# Patient Record
Sex: Female | Born: 1995
Health system: Southern US, Community
[De-identification: ages and names within clinical notes are randomized; demographics above are authoritative.]

## PROBLEM LIST (undated history)

## (undated) DIAGNOSIS — F419 Anxiety disorder, unspecified: Secondary | ICD-10-CM

## (undated) DIAGNOSIS — F32A Depression, unspecified: Secondary | ICD-10-CM

## (undated) DIAGNOSIS — F329 Major depressive disorder, single episode, unspecified: Secondary | ICD-10-CM

## (undated) DIAGNOSIS — S93409A Sprain of unspecified ligament of unspecified ankle, initial encounter: Secondary | ICD-10-CM

## (undated) DIAGNOSIS — J45909 Unspecified asthma, uncomplicated: Secondary | ICD-10-CM

## (undated) DIAGNOSIS — F509 Eating disorder, unspecified: Secondary | ICD-10-CM

## (undated) DIAGNOSIS — S46219A Strain of muscle, fascia and tendon of other parts of biceps, unspecified arm, initial encounter: Secondary | ICD-10-CM

## (undated) HISTORY — DX: Sprain of unspecified ligament of unspecified ankle, initial encounter: S93.409A

## (undated) HISTORY — DX: Strain of muscle, fascia and tendon of other parts of biceps, unspecified arm, initial encounter: S46.219A

## (undated) HISTORY — DX: Major depressive disorder, single episode, unspecified: F32.9

## (undated) HISTORY — DX: Eating disorder, unspecified: F50.9

## (undated) HISTORY — DX: Depression, unspecified: F32.A

## (undated) HISTORY — DX: Unspecified asthma, uncomplicated: J45.909

## (undated) HISTORY — PX: FRACTURE SURGERY: SHX138

## (undated) HISTORY — PX: OTHER SURGICAL HISTORY: SHX169

---
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
## 2017-02-22 ENCOUNTER — Encounter: Payer: Self-pay | Admitting: *Deleted

## 2017-02-22 ENCOUNTER — Encounter: Payer: 59 | Attending: Pediatrics | Admitting: *Deleted

## 2017-02-22 DIAGNOSIS — F509 Eating disorder, unspecified: Secondary | ICD-10-CM | POA: Diagnosis not present

## 2017-02-22 DIAGNOSIS — Z713 Dietary counseling and surveillance: Secondary | ICD-10-CM | POA: Diagnosis not present

## 2017-02-22 NOTE — Progress Notes (Signed)
Appointment start time: 0800  Appointment end time: 0900  Patient was seen on 02/22/17 for nutrition counseling pertaining to disordered eating  Primary care provider: Duffy Bruce  Therapist: Florentina Jenny  Any other medical team members: none currently.  Wants referral to [place] "therapist made me come" because I have "weird eating behavior with depression".  Goes through periods of restriction and purging.  (SIV) States she is kinda ok right now.  A couple weeks ago things were worse.  Last summer she restricted heavily and ate the same thing.  Florentina Jenny called me yesterday saying Amani is very sick and might not be appropriate for college in the fall  Started in 2012 and she' isn't sure why.   Started seeing karla 2 weeks ago  Saw pediatrician with [place] experience in 2013, but behaviors went away and depression was worse so dealt with depression.  Eating resurfaced last summer, but didn't realize it until April or May.    Would like to be able to "not be freaked out by food all the time."  Not checking weight currently as she is avoiding it, but sometimes checks multiple times a day  Nobody really cooks at home.  They go out to eat or eat up frozen things.  Dad cooks more than anyone. Dad or [employer].  Will go to [employer] this fall and wants to live on campus.  And will have a meal plan.  States she is "fine", but then changes that.  Last summer she was in [employer] and ate salads twice daily in the cafeteria by herself and ate really quickly.  She is worried about getting into negative habits.    States she is not SIV currently due to fear of dental erosion.  Still really wants to, but doesn't. Currently not engaging in weight control behaviors and that is really stressful.   Lost 30 pounds by last August and gained 40 pounds since.    Mom recommends weight loss and has not been receptive to feedback from previous therapists making recommendations on [place]  behalf.  Mom put Deira and her twin on diets as children, but denies it  Not currently physically active due to back pain   Growth Metrics: Median BMI for age: 53.5 BMI today: 31 % median today:  100%+ Previous growth data: weight/age  54-95; height/age at 25-50; BMI/age 66-97% Not weight restoration needed.  Goal is to normalize eating and behaviors   Medical Information:  Changes in hair, skin, nails since ED started: none reported Chewing/swallowing difficulties : none Relux or heartburn: some after purging Trouble with teeth: none.  Saw dentist on Monday LMP without the use of hormones: 02/14/17.  regular   Constipation, diarrhea: none reported.  BM every couple day.  Not a strain.  No blood Some dizziness, tingling  In hands and feet Headaches and backaches for 4 months.  Seeing specialist soon No vision changes Low energy level.  Always been tired.  Takes naps during the day Sometimes difficult to concentrate Sleeping 8 hours.  Still feels tired when she wakes up.  Never had sleep study No blood work recently for deficiency.    Things have actually improved somewhat, but still tired.    Negative for cold intolerance.  Poor body image.  Wears long sleeves   Dietary assessment: A typical day consists of 3 meals and 1 snacks  Safe foods include: tostitos, diet mt dew, frozen yogurt, granola bars, peas, Avoided foods include:nothing is more  scary than others, but most food in general other than safe foods are "bad"  24 hour recall:  B: toast with nutella (1) with diet mt dew L: veggie burger on bun with lettuce tomato, onion, cheese; fries with ranch.  Sprite S: popcorn. Regular mt dew S: ice cream (chocolate and strawberry) The regular soda was not normal  Typical B: granola bar (quaker smores) with diet mt dew L: grilled cheese sandwich and peas, diet mt dew S: tositos- rarely with salsa or spinach dip D: burrito with rice, pinto, lettuce, corn, tomatoes, sour  cream Beverages: 5-6 20 oz bottles, 1-2 bottles water   EAT-26 administered Patient score: 28  Estimated energy intake: 1000 kcal  Estimated energy needs: 1800-2000 kcal 225-250 g CHO 90-100 g pro 60-67 g fat  Nutrition Diagnosis: NI-1.4 Inadequate energy intake As related to disordered eating.  As evidenced by dietary recall.  Intervention/Goals: Nutrition counseling provided.  Challenged beauty myth.  Discussed normal growth/development of adolescents.  explained that while she is 21 years old, parental involvement would be recommended if she permitted.  She is very concerned that mom would not be receptive  Recommended equal water to Diet Mt Dew    Monitoring and Evaluation: Patient will follow up in 2 weeks.

## 2017-02-24 ENCOUNTER — Encounter: Payer: Self-pay | Admitting: Emergency Medicine

## 2017-02-24 ENCOUNTER — Emergency Department: Payer: 59

## 2017-02-24 ENCOUNTER — Emergency Department
Admission: EM | Admit: 2017-02-24 | Discharge: 2017-02-24 | Disposition: A | Discharge status: Home / Self Care | Payer: 59 | Attending: Emergency Medicine | Admitting: Emergency Medicine

## 2017-02-24 DIAGNOSIS — M549 Dorsalgia, unspecified: Secondary | ICD-10-CM

## 2017-02-24 DIAGNOSIS — J45909 Unspecified asthma, uncomplicated: Secondary | ICD-10-CM | POA: Insufficient documentation

## 2017-02-24 DIAGNOSIS — R519 Headache, unspecified: Secondary | ICD-10-CM

## 2017-02-24 DIAGNOSIS — R51 Headache: Secondary | ICD-10-CM | POA: Diagnosis not present

## 2017-02-24 DIAGNOSIS — R200 Anesthesia of skin: Secondary | ICD-10-CM | POA: Diagnosis present

## 2017-02-24 DIAGNOSIS — R202 Paresthesia of skin: Secondary | ICD-10-CM

## 2017-02-24 DIAGNOSIS — Z79899 Other long term (current) drug therapy: Secondary | ICD-10-CM | POA: Insufficient documentation

## 2017-02-24 LAB — URINALYSIS, COMPLETE (UACMP) WITH MICROSCOPIC
BILIRUBIN URINE: NEGATIVE
Bacteria, UA: NONE SEEN
Glucose, UA: NEGATIVE mg/dL
Hgb urine dipstick: NEGATIVE
Ketones, ur: NEGATIVE mg/dL
LEUKOCYTES UA: NEGATIVE
Nitrite: NEGATIVE
Protein, ur: NEGATIVE mg/dL
RBC / HPF: NONE SEEN RBC/hpf (ref 0–5)
SPECIFIC GRAVITY, URINE: 1.015 (ref 1.005–1.030)
pH: 5 (ref 5.0–8.0)

## 2017-02-24 LAB — BASIC METABOLIC PANEL
Anion gap: 8 (ref 5–15)
BUN: 10 mg/dL (ref 6–20)
CHLORIDE: 109 mmol/L (ref 101–111)
CO2: 22 mmol/L (ref 22–32)
CREATININE: 0.84 mg/dL (ref 0.44–1.00)
Calcium: 8.8 mg/dL — ABNORMAL LOW (ref 8.9–10.3)
GFR calc Af Amer: >60 mL/min (ref >60)
GFR calc non Af Amer: >60 mL/min (ref >60)
GLUCOSE: 117 mg/dL — AB (ref 65–99)
Potassium: 3.5 mmol/L (ref 3.5–5.1)
Sodium: 139 mmol/L (ref 135–145)

## 2017-02-24 LAB — CBC
HCT: 37.3 % (ref 35.0–47.0)
Hemoglobin: 12.9 g/dL (ref 12.0–16.0)
MCH: 28 pg (ref 26.0–34.0)
MCHC: 34.5 g/dL (ref 32.0–36.0)
MCV: 81.1 fL (ref 80.0–100.0)
Platelets: 308 10*3/uL (ref 150–440)
RBC: 4.6 MIL/uL (ref 3.80–5.20)
RDW: 14 % (ref 11.5–14.5)
WBC: 11.2 10*3/uL — ABNORMAL HIGH (ref 3.6–11.0)

## 2017-02-24 LAB — POCT PREGNANCY, URINE: PREG TEST UR: NEGATIVE

## 2017-02-24 LAB — TROPONIN I: Troponin I: <0.03 ng/mL (ref <0.03)

## 2017-02-24 MED ORDER — GADOBENATE DIMEGLUMINE 529 MG/ML IV SOLN
15.0000 mL | Freq: Once | INTRAVENOUS | Status: AC | PRN
  Administered 2017-02-24: 15 mL via INTRAVENOUS

## 2017-02-24 NOTE — ED Provider Notes (Signed)
[facility] Emergency Department Provider Note  ____________________________________________  Time seen: Approximately 6:34 PM  I have reviewed the triage vital signs and the nursing notes.   HISTORY  Chief Complaint Headache and Numbness   HPI Sharon Barton is a 21 y.o. female with history of anxiety and depression who presents for evaluation of multiple complaints. Patient reports that for the last 4 months she has been having several weekly episodes of headache, upper back pain, intermittent difficulty initiating urination, has been feeling clumsy and bumping into things, has had intermittent paresthesias of her extremities worse on the right upper extremity. She saw her primary care doctor on Tuesday who sent her for x-ray of her thoracic spine which according to patient was negative. Today she called her primary care doctor to let her know that her symptoms were worse and she was instructed to come to the emergency room for evaluation. Patient denies visual changes, weakness, trauma, fevers, unintentional weight loss. She reports that her pain in her back is constant, moderate, located at the level of her bra in the midline. Her HAs are worse at night and woke her up from sleep, they are mild to moderate located mostly on the R frontal area. She denies family history of demyelinating disorders. No urinary or bowel incontinence or retention, no saddle anesthesia.  Past Medical History:  Diagnosis Date  . Anxiety and depression   . Asthma     There are no active problems to display for this patient.   History reviewed. No pertinent surgical history.  Prior to Admission medications   Medication Sig Start Date End Date Taking? Authorizing Provider  Brexpiprazole (REXULTI) 1 MG TABS Take by mouth.    [provider]  cholecalciferol (VITAMIN D) 1000 units tablet Take 1,000 Units by mouth daily.    [provider]  cloNIDine  (CATAPRES) 0.2 MG tablet Take 0.2 mg by mouth 2 (two) times daily.    [provider]  cyclobenzaprine (FLEXERIL) 10 MG tablet Take 1 tablet (10 mg total) by mouth 3 (three) times daily as needed for muscle spasms. Patient not taking: Reported on 02/22/2017 10/25/16   Norval Gable, MD  lamoTRIgine (LAMICTAL) 150 MG tablet Take 100 mg by mouth daily.     [provider]  lurasidone (LATUDA) 80 MG TABS tablet Take 80 mg by mouth daily with breakfast.    [provider]  sertraline (ZOLOFT) 100 MG tablet Take 150 mg by mouth daily.     [provider]  Irwin 28 0.25-35 MG-MCG tablet Take 1 tablet by mouth daily. 02/03/17   [provider]    Allergies Patient has no known allergies.  Family History  Problem Relation Age of Onset  . Hypertension Father   . Cancer Maternal Grandmother     Social History Social History  Substance Use Topics  . Smoking status: Never Smoker  . Smokeless tobacco: Never Used  . Alcohol use No    Review of Systems  Constitutional: Negative for fever. Eyes: Negative for visual changes. ENT: Negative for sore throat. Neck: No neck pain  Cardiovascular: Negative for chest pain. Respiratory: Negative for shortness of breath. Gastrointestinal: Negative for abdominal pain, vomiting or diarrhea. Genitourinary: Negative for dysuria. + difficulty initiating urine Musculoskeletal: + upper back pain. Skin: Negative for rash. Neurological: + HA, intermittent paresthesias of all 4 extremities Psych: No SI or HI  ____________________________________________   PHYSICAL EXAM:  VITAL SIGNS: ED Triage Vitals  Enc Vitals  Group     BP 02/24/17 1554 (!) 145/78     Pulse Rate 02/24/17 1554 97     Resp 02/24/17 1554 16     Temp 02/24/17 1554 98.4 F (36.9 C)     Temp Source 02/24/17 1554 Oral     SpO2 02/24/17 1554 97 %     Weight 02/24/17 1555 190 lb (86.2 kg)     Height 02/24/17 1555 5\' 3"  (1.6 m)     Head  Circumference --      Peak Flow --      Pain Score 02/24/17 1554 4     Pain Loc --      Pain Edu? --      Excl. in [place]? --     Constitutional: Alert and oriented. Well appearing and in no apparent distress. HEENT:      Head: Normocephalic and atraumatic.         Eyes: Conjunctivae are normal. Sclera is non-icteric. PERRL, bilateral red reflexes present       Mouth/Throat: Mucous membranes are moist.       Neck: Supple with no signs of meningismus. Cardiovascular: Regular rate and rhythm. No murmurs, gallops, or rubs. 2+ symmetrical distal pulses are present in all extremities. No JVD. Respiratory: Normal respiratory effort. Lungs are clear to auscultation bilaterally. No wheezes, crackles, or rhonchi.  Gastrointestinal: Soft, non tender, and non distended with positive bowel sounds. No rebound or guarding. Genitourinary: No CVA tenderness. Musculoskeletal: Patient has diffuse R paraspinal ttp, no midline c/t/l spine ttp. Nontender with normal range of motion in all extremities. No edema, cyanosis, or erythema of extremities. Neurologic: Normal speech and language. A & O x3, PERRL, no nystagmus, no visual field deficits, CN II-XII intact, motor testing reveals good tone and bulk throughout. There is no evidence of pronator drift or dysmetria. Muscle strength is 5/5 throughout. Deep tendon reflexes are 1+ throughout with downgoing toes. Sensory examination is intact. Gait is normal. Skin: Skin is warm, dry and intact. No rash noted. Psychiatric: Mood and affect are normal. Speech and behavior are normal.  ____________________________________________   LABS (all labs ordered are listed, but only abnormal results are displayed)  Labs Reviewed  BASIC METABOLIC PANEL - Abnormal; Notable for the following:       Result Value   Glucose, Bld 117 (*)    Calcium 8.8 (*)    All other components within normal limits  CBC - Abnormal; Notable for the following:    WBC 11.2 (*)    All other  components within normal limits  URINALYSIS, COMPLETE (UACMP) WITH MICROSCOPIC - Abnormal; Notable for the following:    Color, Urine YELLOW (*)    APPearance CLEAR (*)    Squamous Epithelial / LPF 0-5 (*)    All other components within normal limits  TROPONIN I  POCT PREGNANCY, URINE   ____________________________________________  EKG  ED ECG REPORT I, Rudene Re, the attending physician, personally viewed and interpreted this ECG.  Normal sinus rhythm, rate of 95, normal intervals, normal axis, no ST elevations or depressions. ____________________________________________  RADIOLOGY  Head CT:  Negative  MRI head and thoracic spine: 1. No acute intracranial abnormality or abnormal enhancement. 2. Faint white matter T2 FLAIR hyperintensities as described above of unlikely clinical significance. No specific findings for demyelination, vasculopathy, or infectious/inflammatory sequelae. Is symptoms persist consider 6 month follow-up with MRI of the brain. ____________________________________________   PROCEDURES  Procedure(s) performed: None Procedures Critical Care performed:  None ____________________________________________  INITIAL IMPRESSION / ASSESSMENT AND PLAN / ED COURSE  21 y.o. female with history of anxiety and depression who presents for evaluation of multiple complaints including weekly headaches, upper back pain, intermittent paresthesias of her extremities, difficulty initiating urine. Symptoms have all been going on for 4 months. Patient is extremely well appearing, in no distress, her vital signs are within normal limits, patient is neurologically intact per exam above. Unclear etiology of patient's symptoms however this could be due to a demyelinating disorder. We'll do a head CT to rule out brain tumor. We'll check basic blood work and urinalysis. Postvoid residual was done on patient which was 32 cc. Plan to f/u results of labs, urine, head CT and if  all negative patient will be referred to Neurology for further evaluation.     _________________________ 6:47 PM on 02/24/2017 -----------------------------------------  Labs, urine, and head CT negative. Will get MRI to rule out Demyelinating disorder or transverse myelitis.  _________________________ 11:11 PM on 02/24/2017 -----------------------------------------  MRIs with no acute findings. Patient remains completely neurologically intact. Patient can be discharged home at this time and follow-up with pediatrician.  Pertinent labs & imaging results that were available during my care of the patient were reviewed by me and considered in my medical decision making (see chart for details).    ____________________________________________   FINAL CLINICAL IMPRESSION(S) / ED DIAGNOSES  Final diagnoses:  Acute nonintractable headache, unspecified headache type  Upper back pain on right side  Paresthesia  Back pain      NEW MEDICATIONS STARTED DURING THIS VISIT:  New Prescriptions   No medications on file     Note:  This document was prepared using Dragon voice recognition software and may include unintentional dictation errors.    Rudene Re, MD 02/24/17 2312

## 2017-02-24 NOTE — ED Notes (Signed)
Patient transported to CT 

## 2017-02-24 NOTE — ED Notes (Signed)
Patient r/f MRI

## 2017-02-24 NOTE — ED Triage Notes (Signed)
Pt in via POV with parents, advised to be seen per pediatrician.  Pt with intermittent headaches, back pain and numbness to right arm x 4 months.  Pt reports only imaging to be done is back xray which was unremarkable.  Pt denies any fevers, denies any changes to vision.  Vitals WDL, NAD noted at this time.

## 2017-02-24 NOTE — ED Notes (Signed)
Patient transported to MRI 

## 2017-03-09 ENCOUNTER — Encounter: Payer: 59 | Admitting: *Deleted

## 2017-03-09 DIAGNOSIS — Z713 Dietary counseling and surveillance: Secondary | ICD-10-CM | POA: Diagnosis not present

## 2017-03-09 DIAGNOSIS — F509 Eating disorder, unspecified: Secondary | ICD-10-CM

## 2017-03-09 NOTE — Patient Instructions (Signed)
Body Positive Power by Celedonio Miyamoto- [email or web address] 

## 2017-03-09 NOTE — Progress Notes (Signed)
Appointment start time: 1400  Appointment end time: 1500  Patient was seen on 03/10/17 for nutrition counseling pertaining to disordered eating  Primary care provider: Duffy Bruce  Therapist: Florentina Jenny  Any other medical team members: none currently.     Assessment Referral to adolescent medicine placed.  Mom did not return the call to schedule the appointment. Red pod not available by skype today.  I gave Shanicka the number to call herself to get scheduled asap  Is not doing well.  Is experiencing a lot of tingling, pain, lightheadedness, headaches.  Multiple visits to neuro Sleeping ok, poor energy. Sometimes takes naps during the day.   Wakes up tired  It also upset because she was supposed to do to [place].  She realized that she shouldn't go.  Sees Karla every week.  Going ok. Eating is going "ok".  She is stressed out and "eating crap" and that stresses her out.  She is worried about starting school in the fall She is really overwhelmed about school starting.  Became very upset and emotional in session multiple times today.  Took a long time to calm herself down.   I have spoken to [place] who thinks she is not ready for college in the fall.  Talking with Jennika today I am also concerned about her overall mental health.  She seems very overwhelmed.   She is also upset because she feels she has been eating inappropriately due to being upset and home alone    Growth Metrics: Median BMI for age: 89.5 BMI today: 31 % median today:  100%+ Previous growth data: weight/age  34-95; height/age at 25-50; BMI/age 80-97% Not weight restoration needed.  Goal is to normalize eating and behaviors    Dietary assessment: A typical day consists of 2-3 meals and 1 snacks  Safe foods include: tostitos, diet mt dew, frozen yogurt, granola bars, peas, Avoided foods include:nothing is more scary than others, but most food in general other than safe foods are "bad"  24 hour recall:  B: slept L:  pasta with broccoli S: fruit snacks D: burrito and tortilla chips Beverages: 2 water, 60 diet mt dew    Estimated energy intake: 1000 kcal   Estimated energy needs: 1800-2000 kcal 225-250 g CHO 90-100 g pro 60-67 g fat  Nutrition Diagnosis: NI-1.4 Inadequate energy intake As related to disordered eating.  As evidenced by dietary recall.  Intervention/Goals: Nutrition counseling provided.  Please call Dr. Blanch Media office.  Attempted to discuss ways to lessen stress, but she was not relieved.  She became more agitated.    Discussed need for 3 meals/day.  Challenge diet/DE messages.  Suggested Body Positive Power book Please drink more water    Monitoring and Evaluation: Patient will follow up in 2 weeks.

## 2017-03-21 ENCOUNTER — Encounter: Payer: 59 | Attending: Pediatrics | Admitting: *Deleted

## 2017-03-21 DIAGNOSIS — Z713 Dietary counseling and surveillance: Secondary | ICD-10-CM | POA: Diagnosis not present

## 2017-03-21 DIAGNOSIS — F509 Eating disorder, unspecified: Secondary | ICD-10-CM | POA: Insufficient documentation

## 2017-03-21 DIAGNOSIS — E639 Nutritional deficiency, unspecified: Secondary | ICD-10-CM

## 2017-03-21 NOTE — Patient Instructions (Addendum)
Read Jes Baker's Things No One Tells Fat Girls  Breakfast: english muffin with egg or cheese Yogurt parfait with granola and fruit Eggs with anything  Try to have protein and fiber and fat with all meals to help you stay fullner longer Continue drinking more water over diet mt dew   Snacks Banana and cheesestick or yogurt Bars [facility] Protein or Luna/Lara/Clif) Yogurt    Make a list of foods you like Write down nutrition concerns.     Call Dr. Blanch Media office to schedule appt [phone]

## 2017-03-21 NOTE — Progress Notes (Signed)
Appointment start time: 0830 Appointment end time: 0930  Patient was seen on 03/21/17 for nutrition counseling pertaining to disordered eating  Primary care provider: Duffy Bruce  Therapist: Florentina Jenny  Any other medical team members: none currently.     Assessment Still not scheduled with adolescent medicine.  Lost the number Things are going ok.  Has a job interview today.  Is much better emotionally than last time Is moving into college this week, Is considering dropping a class, but also doesn't want to look "like a slacker" when she tries to transfer  Eating is fine, but talking about it make her nervous.  In session today she became very anxious and tearful.  When asked about her feelings, she responds "I don't know".  She rocks in her seat in tears and takes several moments to collect herself.  Several times.  This is a similar reaction to last visit.    When she is calmer, she is able to say that she is unhappy with her body and feels overwhelmed with food rules.  Feels guilty about her food choices/feels judged.  Is concerned she will not be taken seriously because of her weight and wants to be thinner to be more pretty and more accepted.    Lost 30 pounds in the past and gained 45 back.      Growth Metrics: Median BMI for age: 39.5 BMI today: 31 % median today:  100%+ Previous growth data: weight/age  56-95; height/age at 25-50; BMI/age 69-97% Not weight restoration needed.  Goal is to normalize eating and behaviors    Dietary assessment: A typical day consists of 2-3 meals and 1 snacks  Safe foods include: tostitos, diet mt dew, frozen yogurt, granola bars, peas, Avoided foods include:nothing is more scary than others, but most food in general other than safe foods are "bad"  24 hour recall:  B: english muffin L: cheese and vegetable sandwich, fries, mint ice  Cream D: salad, fries Beverages: water, diet mt dew    Estimated energy intake: 1000-1200 kcal    Estimated energy needs: 1800-2000 kcal 225-250 g CHO 90-100 g pro 60-67 g fat  Nutrition Diagnosis: NI-1.4 Inadequate energy intake As related to disordered eating.  As evidenced by dietary recall.  Intervention/Goals: Nutrition counseling provided.  Please call Dr. Blanch Media office.   Challenged diet mentality and beauty standards.  Discussed metabolic effects of dieting and why that will never be successful.  Discussed ideas of body liberation and nourishing herself the way she deserves.  She was still very anxious and not able to articulate much of a response  *I scheduled her with red pod via Skype for 8/28   Monitoring and Evaluation: Patient will follow up in 2 weeks.

## 2017-04-06 ENCOUNTER — Encounter: Payer: 59 | Admitting: *Deleted

## 2017-04-06 ENCOUNTER — Encounter: Payer: Self-pay | Admitting: Pediatrics

## 2017-04-06 DIAGNOSIS — Z713 Dietary counseling and surveillance: Secondary | ICD-10-CM | POA: Diagnosis not present

## 2017-04-06 DIAGNOSIS — E639 Nutritional deficiency, unspecified: Secondary | ICD-10-CM

## 2017-04-06 NOTE — Progress Notes (Signed)
Appointment start time: 0830 Appointment end time: 0930  Patient was seen on 03/21/17 for nutrition counseling pertaining to disordered eating  Primary care provider: Duffy Bruce  Therapist: Florentina Jenny  Any other medical team members: none currently.     Assessment College is going well.  Is happy to be living away from home.  Dropped a class.  Also came out as bisexual. She is adjusting well.  Also got a job at [employer] and starts training next week.  Has appointment with dr Henrene Pastor next week.    Thinks eating is going pretty well.  Eats 3 meals/day in the dining hall except when she misses breakfast and has a protein bar. Eats by herself more often than not doesn't like talking to people.  Does eat with roommate on the weekends.  Overall eating behaviors have improved, but she becomes tearful every session when food is brought up.  She is very very very anxious about eating and her body   Sleeping really well.  Energy is poor.  Not sure why.  Getting more sleep, but wakes up tired.   No GI distress.  Negative for cold intolerance..  No dizziness  Goes to gym at school.  Rides stationary bike some and does weight machines. Does some rock climbing. Was climbing 7 days/week, but realized that was excessive and is cutting back to 3 days/week.  Plans to so some additional machine work.  Will bring bike to campus.    Thinks she is eating too much "junk food" and is adjusting to having more freedom. It's hard to get her protein vegetarian in the dining hall.  Has been eating eggs   Growth Metrics: Median BMI for age: 72.5 BMI today: 31 % median today:  100%+ Previous growth data: weight/age  66-95; height/age at 25-50; BMI/age 60-97% Not weight restoration needed.  Goal is to normalize eating and behaviors    Dietary assessment: A typical day consists of 2-3 meals and 1 snacks  Safe foods include: tostitos, diet mt dew, frozen yogurt, granola bars, peas, Avoided foods include:nothing  is more scary than others, but most food in general other than safe foods are "bad"  24 hour recall:  B: Luna bar L: pasta' S: twix D: veggie wrap Water and diet mt dew   Estimated energy intake: 1000-1200 kcal   Estimated energy needs: 1800-2000 kcal 225-250 g CHO 90-100 g pro 60-67 g fat  Nutrition Diagnosis: NI-1.4 Inadequate energy intake As related to disordered eating.  As evidenced by dietary recall.  Intervention/Goals: Nutrition counseling provided. Please talk with dr Henrene Pastor about poor energy, in addition to other medication needs.  Suggested [employer] people on [employer] and Body Positive Power.   Challenged diet mentality/fatphobia.  Emphasized need for adequate fuel   Monitoring and Evaluation: Patient will follow up in 2 weeks.

## 2017-04-11 ENCOUNTER — Ambulatory Visit (INDEPENDENT_AMBULATORY_CARE_PROVIDER_SITE_OTHER): Payer: 59 | Admitting: Clinical

## 2017-04-11 ENCOUNTER — Encounter: Payer: Self-pay | Admitting: Family

## 2017-04-11 ENCOUNTER — Ambulatory Visit (INDEPENDENT_AMBULATORY_CARE_PROVIDER_SITE_OTHER): Payer: 59 | Admitting: Family

## 2017-04-11 VITALS — BP 116/76 | HR 94 | Ht 63.0 in | Wt 197.6 lb

## 2017-04-11 DIAGNOSIS — Z3202 Encounter for pregnancy test, result negative: Secondary | ICD-10-CM

## 2017-04-11 DIAGNOSIS — F419 Anxiety disorder, unspecified: Secondary | ICD-10-CM

## 2017-04-11 DIAGNOSIS — Z1389 Encounter for screening for other disorder: Secondary | ICD-10-CM

## 2017-04-11 DIAGNOSIS — Z113 Encounter for screening for infections with a predominantly sexual mode of transmission: Secondary | ICD-10-CM

## 2017-04-11 DIAGNOSIS — F411 Generalized anxiety disorder: Secondary | ICD-10-CM | POA: Diagnosis not present

## 2017-04-11 LAB — POCT URINALYSIS DIPSTICK
Bilirubin, UA: NEGATIVE
Blood, UA: NEGATIVE
Glucose, UA: NEGATIVE
Ketones, UA: NEGATIVE
LEUKOCYTES UA: NEGATIVE
NITRITE UA: NEGATIVE
PROTEIN UA: NEGATIVE
Spec Grav, UA: 1.015 (ref 1.010–1.025)
UROBILINOGEN UA: NEGATIVE U/dL — AB
pH, UA: 5 (ref 5.0–8.0)

## 2017-04-11 LAB — POCT RAPID HIV: RAPID HIV, POC: NEGATIVE

## 2017-04-11 LAB — POCT URINE PREGNANCY: Preg Test, Ur: NEGATIVE

## 2017-04-11 MED ORDER — HYDROXYZINE PAMOATE 25 MG PO CAPS: 25.0000 mg | ORAL_CAPSULE | Freq: Three times a day (TID) | ORAL | 0 refills | Status: DC | PRN
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
## 2017-04-11 NOTE — BH Specialist Note (Addendum)
Integrated Behavioral Health Initial Visit  MRN: [id number] Name: Sharon Barton   Session Start time: 2:02 PM  Session End time: 2:23pm Total time: 73min  Type of Service: [place] Interpretor:No. Interpretor Name and Language: n/a   Warm Hand Off Completed.       SUBJECTIVE: Sharon Barton is a 21 y.o. female accompanied by patient. Patient was referred by Dr. Mady Gemma. Millican for disordered eating and anxiety symptoms. 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 Millican, FNP  ASSESSMENT: Patient currently experiencing significant anxiety during the visit and interested in medication management for her symptoms.  Patient may benefit from continuing psycho therapy and re-evaluation of current medication management of anxiety symptoms.  PLAN: 1. Follow up with behavioral health clinician on : No f/u scheduled since patient has ongoing psycho therapist, K. Townsend. 2. Behavioral recommendations:  * Continue with psycho therapy * Complete evaluation with Adolescent Team 3. Referral(s): None at this  time 4. "From scale of 1-10, how likely are you to follow plan?": Pt agreed to above plan  Toney Rakes, LCSW

## 2017-04-11 NOTE — Patient Instructions (Signed)
Take hydroxyzine (vistaril 25 mg) every 8 hours as needed for anxiety.

## 2017-04-12 LAB — GC/CHLAMYDIA PROBE AMP
CT Probe RNA: NOT DETECTED
GC Probe RNA: NOT DETECTED

## 2017-04-12 NOTE — Addendum Note (Signed)
Addended by: Fanny Dance on: 04/12/2017 03:38 PM   Modules accepted: Level of Service

## 2017-04-19 ENCOUNTER — Encounter: Payer: 59 | Attending: Pediatrics | Admitting: *Deleted

## 2017-04-19 DIAGNOSIS — F509 Eating disorder, unspecified: Secondary | ICD-10-CM | POA: Insufficient documentation

## 2017-04-19 DIAGNOSIS — E639 Nutritional deficiency, unspecified: Secondary | ICD-10-CM

## 2017-04-19 DIAGNOSIS — Z713 Dietary counseling and surveillance: Secondary | ICD-10-CM | POA: Insufficient documentation

## 2017-04-19 NOTE — Patient Instructions (Addendum)
Read Body Positive Power Drink more caffeine free fluids, preferably water, but also caffeine free soda Pick up medication Try to eat without distractions.  Use hunger scale

## 2017-04-19 NOTE — Progress Notes (Signed)
Appointment start time: 1400 Appointment end time: 1500  Patient was seen on 04/19/17 for nutrition counseling pertaining to disordered eating  Primary care provider: Duffy Bruce  Therapist: Florentina Jenny  Any other medical team members: none currently.     Assessment Missed her appointment with [facility] yesterday.  Thinks it's going ok.  Was elected to [employer]. School is going well.   Has not started hydroxyzine as she forgot about it.  Hopes dad can bring it to campus.    Struggling with body image  Growth Metrics: Median BMI for age: 51.5 BMI today: 31 % median today:  100%+ Previous growth data: weight/age  24-95; height/age at 25-50; BMI/age 42-97% Not weight restoration needed.  Goal is to normalize eating and behaviors    Dietary assessment: A typical day consists of 2-3 meals and 1 snacks  Safe foods include: tostitos, diet mt dew, frozen yogurt, granola bars, peas, Avoided foods include:nothing is more scary than others, but most food in general other than safe foods are "bad"  24 hour recall:  B: fruit loops L: cheese, tomato, onion, pickle sandwich. Tater tots D: veggie sandwich with cheese. With fries and ketchup Beverages: 32 oz water, 20 oz diet dew, 16 oz regular dew   Estimated energy intake: 1000-1200 kcal   Estimated energy needs: 1800-2000 kcal 225-250 g CHO 90-100 g pro 60-67 g fat  Nutrition Diagnosis: NI-1.4 Inadequate energy intake As related to disordered eating.  As evidenced by dietary recall.  Intervention/Goals: Nutrition counseling provided.  Challenged diet mentality/fatphobia.  Emphasized need for adequate fuel and fluids.  Discussed hunger scale, starting her medication, and reading [employer] books   Monitoring and Evaluation: Patient will follow up in 2 weeks.

## 2017-05-04 ENCOUNTER — Encounter: Payer: 59 | Admitting: *Deleted

## 2017-05-04 DIAGNOSIS — E639 Nutritional deficiency, unspecified: Secondary | ICD-10-CM

## 2017-05-04 DIAGNOSIS — Z713 Dietary counseling and surveillance: Secondary | ICD-10-CM | POA: Diagnosis not present

## 2017-05-04 NOTE — Patient Instructions (Addendum)
Need more protein please Try soy milk  Try to have more cheese, yogurt, eggs, almond butter   As snacks:  Bars Fruit and almond butter Yogurt Cheese stick Kefir kashi  Start taking multivitamin  More water, less diet soda   https://highline.lockgannon.com  Everything You Know About Obesity is Wrong Body Positivity Power book Embrace on Netflix
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
## 2017-05-05 ENCOUNTER — Ambulatory Visit: Payer: Self-pay | Admitting: Family

## 2017-05-11 ENCOUNTER — Other Ambulatory Visit: Payer: Self-pay | Admitting: Family

## 2017-05-15 DIAGNOSIS — S46219A Strain of muscle, fascia and tendon of other parts of biceps, unspecified arm, initial encounter: Secondary | ICD-10-CM

## 2017-05-15 HISTORY — DX: Strain of muscle, fascia and tendon of other parts of biceps, unspecified arm, initial encounter: S46.219A

## 2017-05-16 ENCOUNTER — Other Ambulatory Visit: Payer: Self-pay | Admitting: Pediatrics

## 2017-05-16 MED ORDER — BREXPIPRAZOLE 1 MG PO TABS: 1.0000 mg | ORAL_TABLET | Freq: Every day | ORAL | 0 refills | Status: DC

## 2017-05-19 ENCOUNTER — Other Ambulatory Visit: Payer: Self-pay | Admitting: Family

## 2017-05-19 MED ORDER — SERTRALINE HCL 100 MG PO TABS: 150.0000 mg | ORAL_TABLET | Freq: Every day | ORAL | 0 refills | Status: DC

## 2017-05-22 ENCOUNTER — Other Ambulatory Visit: Payer: Self-pay | Admitting: Pediatrics

## 2017-05-22 ENCOUNTER — Telehealth: Payer: Self-pay

## 2017-05-22 MED ORDER — SERTRALINE HCL 100 MG PO TABS: 150.0000 mg | ORAL_TABLET | Freq: Every day | ORAL | 0 refills | Status: DC

## 2017-05-22 NOTE — Telephone Encounter (Signed)
Called patient and made her aware.

## 2017-05-22 NOTE — Telephone Encounter (Signed)
Done

## 2017-05-22 NOTE — Telephone Encounter (Signed)
Pt called requesting refill of Zoloft 100 mg. She has no pills remaining. She requests it to be sent to the [facility] in [place]. Her phone number is [phone].

## 2017-05-26 ENCOUNTER — Ambulatory Visit: Payer: Self-pay | Admitting: Family

## 2017-06-05 ENCOUNTER — Ambulatory Visit: Payer: 59 | Admitting: Pediatrics

## 2017-06-15 DIAGNOSIS — S93409A Sprain of unspecified ligament of unspecified ankle, initial encounter: Secondary | ICD-10-CM

## 2017-06-15 HISTORY — DX: Sprain of unspecified ligament of unspecified ankle, initial encounter: S93.409A

## 2017-06-17 ENCOUNTER — Other Ambulatory Visit: Payer: Self-pay | Admitting: Pediatrics

## 2017-06-22 DIAGNOSIS — M7712 Lateral epicondylitis, left elbow: Secondary | ICD-10-CM | POA: Insufficient documentation

## 2017-07-17 IMAGING — CT CT HEAD W/O CM
3 series · 16 of 46 positions shown, 19 images · non-contrast
Comparison: Head CT scan 12/16/2004.

CLINICAL DATA: Intermittent headaches, back pain and right arm
numbness for 4 months.

EXAM:
CT HEAD WITHOUT CONTRAST
TECHNIQUE: Contiguous axial images were obtained from the base of the skull
through the vertex without intravenous contrast.

[Series 2: head wo · axial · 0.39mm/px · z∈[-193,-73]mm · 10 of 29 slices shown, 13 images]
[im 3/29  brain]
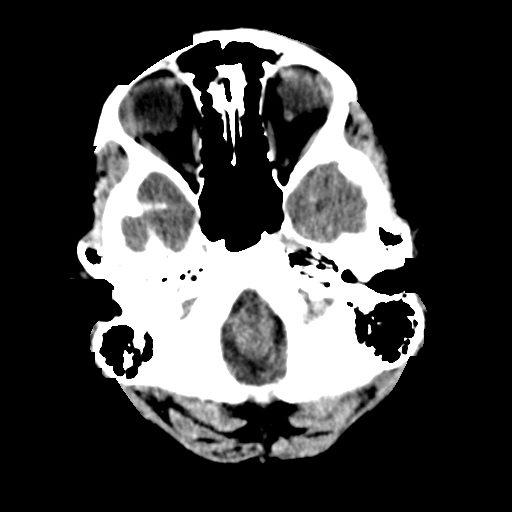
[im 3/29  bone]
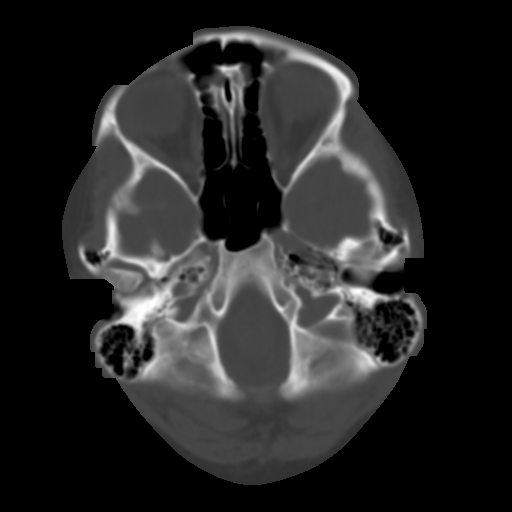
[im 6/29  brain]
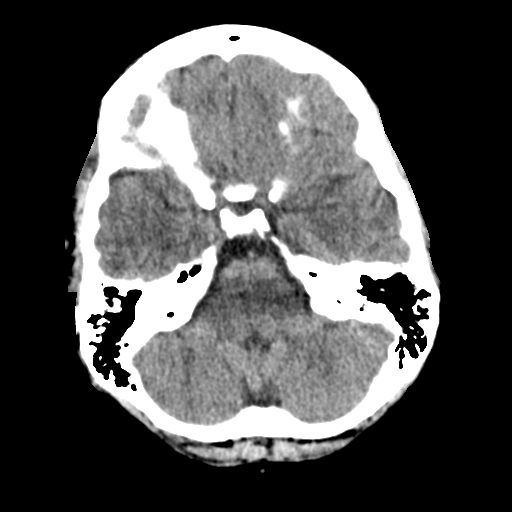
[im 8/29  brain]
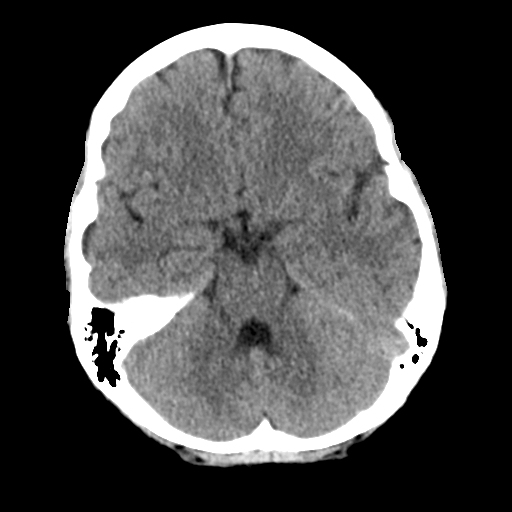
[im 11/29  brain]
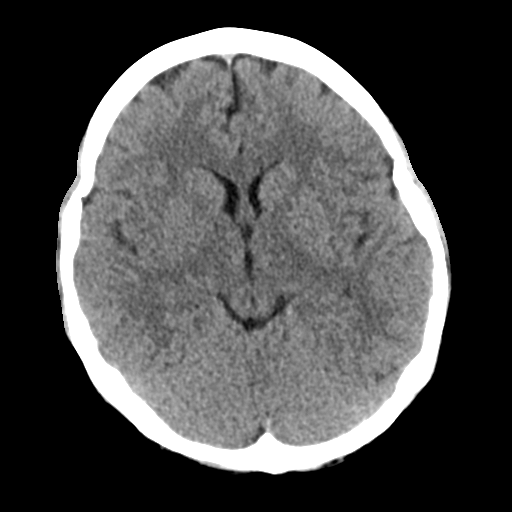
[im 14/29  brain]
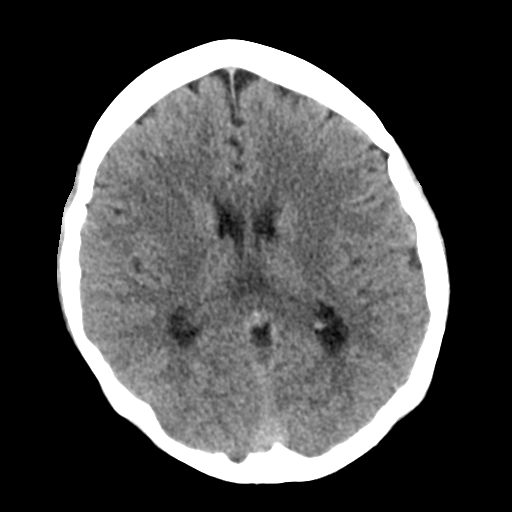
[im 14/29  bone]
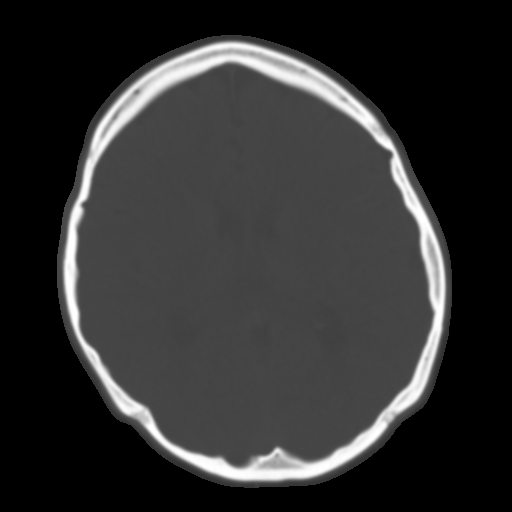
[im 16/29  brain]
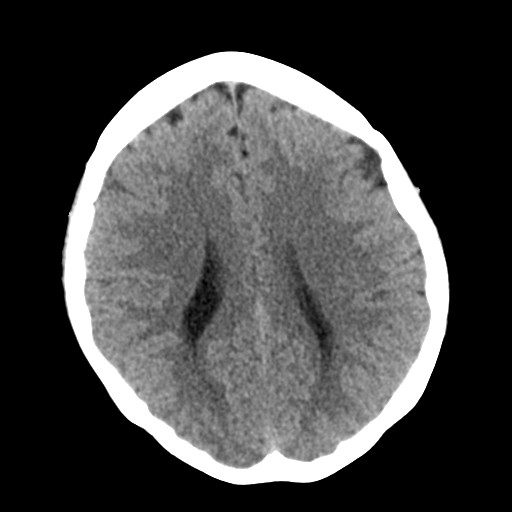
[im 19/29  brain]
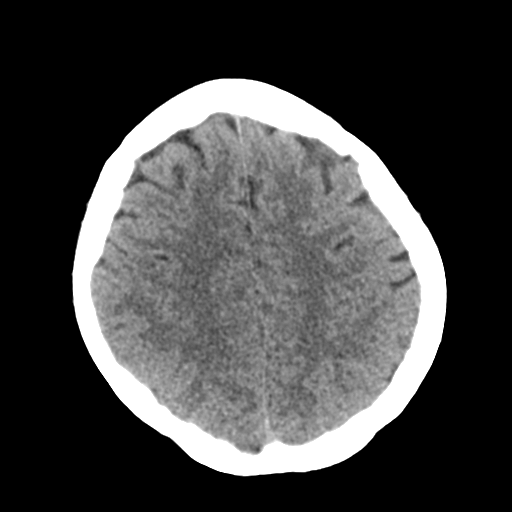
[im 22/29  brain]
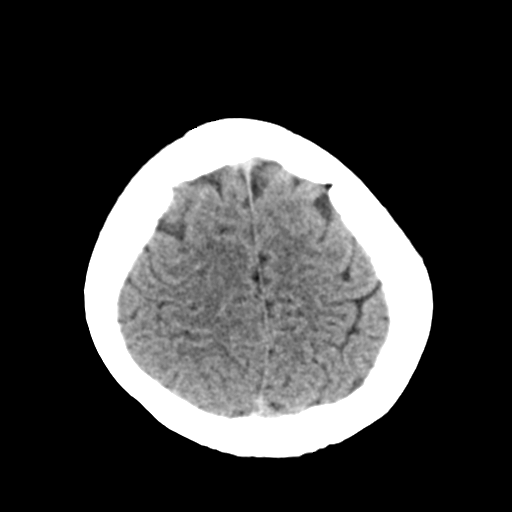
[im 24/29  brain]
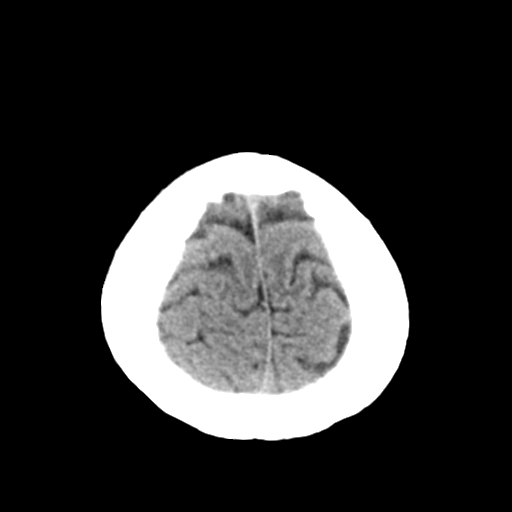
[im 24/29  bone]
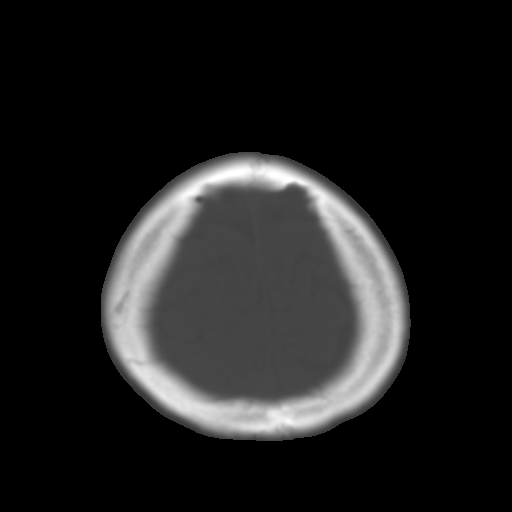
[im 27/29  brain]
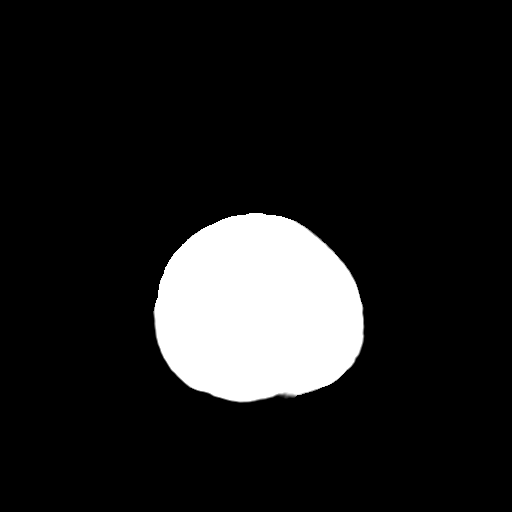

[Series 4: coronal soft tissue · coronal · 0.35mm/px · 3 of 61 slices shown]
[im 21/61  brain]
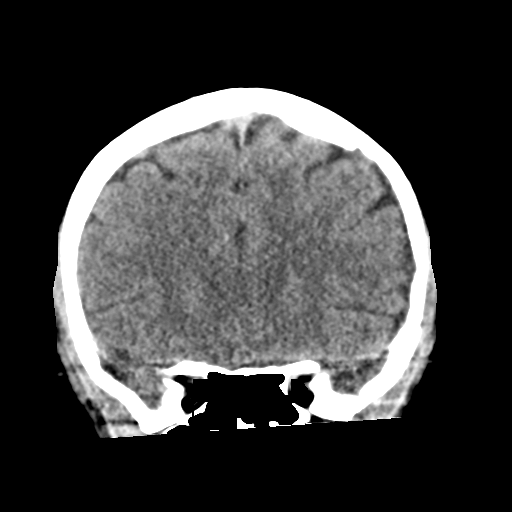
[im 27/61  brain]
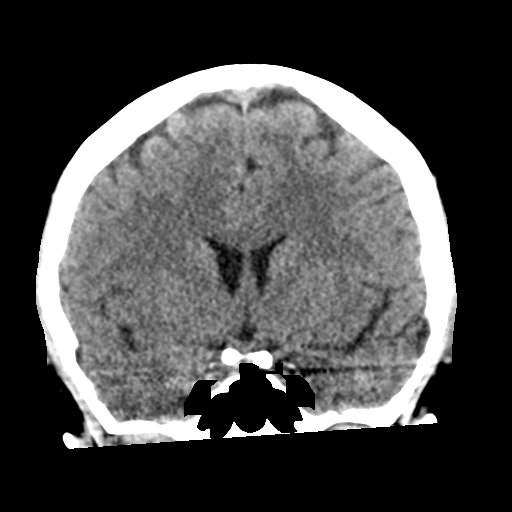
[im 34/61  brain]
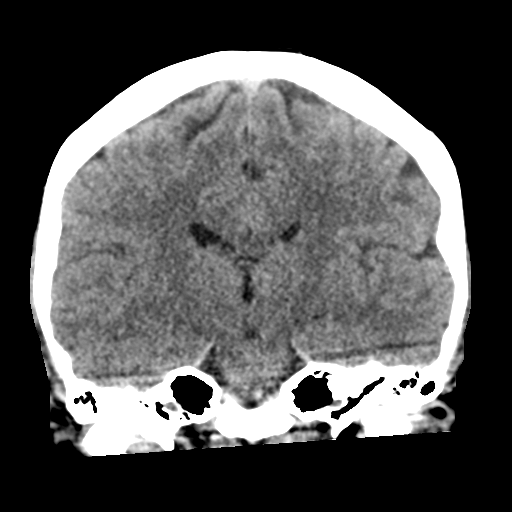

[Series 5: sagittal soft tissue · sagittal · 0.30mm/px · 3 of 53 slices shown]
[im 18/53  brain]
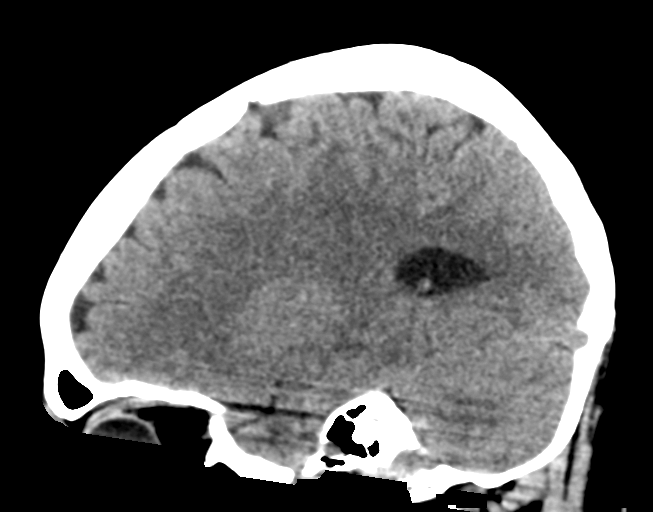
[im 27/53  brain]
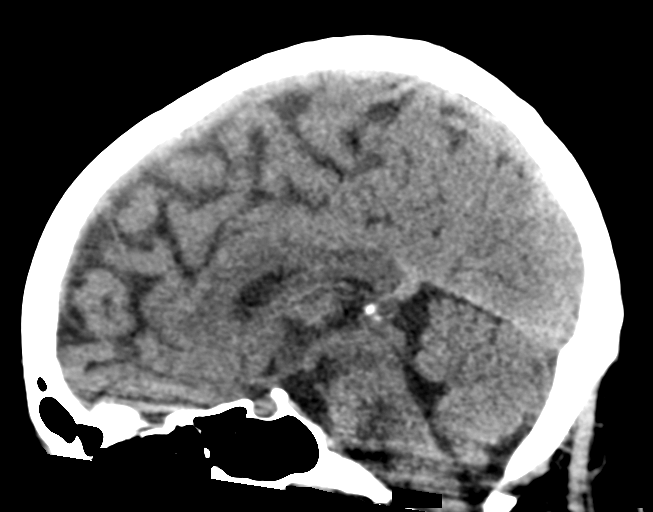
[im 35/53  brain]
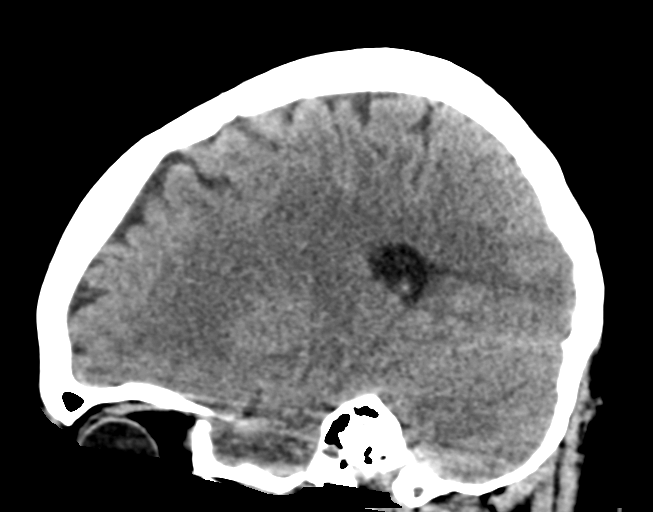

[16 of 46 positions shown; findings below may reference images not displayed]

FINDINGS: Brain: No evidence of acute infarction, hemorrhage, hydrocephalus,
extra-axial collection or mass lesion/mass effect.

Vascular: No hyperdense vessel or unexpected calcification.

Skull: Normal. Negative for fracture or focal lesion.

Sinuses/Orbits:  Negative.

Other: None.
IMPRESSION: Negative head CT.

## 2017-09-22 ENCOUNTER — Ambulatory Visit: Payer: 59 | Admitting: Physician Assistant

## 2017-09-22 ENCOUNTER — Encounter: Payer: Self-pay | Admitting: Physician Assistant

## 2017-09-22 ENCOUNTER — Other Ambulatory Visit: Payer: Self-pay

## 2017-09-22 VITALS — BP 108/76 | HR 91 | Temp 97.9°F | Resp 18 | Ht 63.02 in | Wt 217.4 lb

## 2017-09-22 DIAGNOSIS — Z309 Encounter for contraceptive management, unspecified: Secondary | ICD-10-CM | POA: Diagnosis not present

## 2017-09-22 DIAGNOSIS — F509 Eating disorder, unspecified: Secondary | ICD-10-CM | POA: Diagnosis not present

## 2017-09-22 DIAGNOSIS — F419 Anxiety disorder, unspecified: Secondary | ICD-10-CM

## 2017-09-22 DIAGNOSIS — F329 Major depressive disorder, single episode, unspecified: Secondary | ICD-10-CM

## 2017-09-22 DIAGNOSIS — Z789 Other specified health status: Secondary | ICD-10-CM

## 2017-09-22 MED ORDER — BREXPIPRAZOLE 1 MG PO TABS: 1.0000 mg | ORAL_TABLET | Freq: Every day | ORAL | 3 refills | Status: DC

## 2017-09-22 MED ORDER — HYDROXYZINE PAMOATE 25 MG PO CAPS: ORAL_CAPSULE | ORAL | 0 refills | Status: DC

## 2017-09-22 MED ORDER — SERTRALINE HCL 100 MG PO TABS: 150.0000 mg | ORAL_TABLET | Freq: Every day | ORAL | 3 refills | Status: AC

## 2017-09-22 MED ORDER — SPRINTEC 28 0.25-35 MG-MCG PO TABS: 1.0000 | ORAL_TABLET | Freq: Every day | ORAL | 3 refills | Status: AC

## 2017-09-22 NOTE — Progress Notes (Signed)
Patient ID: Sharon Barton, female     DOB: 09-30-1995, 22 y.o.    MRN: [id number]  PCP: [facility]  Chief Complaint  Patient presents with  . Establish Care  . Depression    Depression scale score 8  . Anxiety    GAD 7 score 8    Subjective:   This patient is new to me and presents for evaluation of anxiety and depression.  Doesn't feel like pediatrics is a good fit any more and needs a new provider. Her therapist, Jeremy Johann recommended me.  She ran out of her medications about 2 weeks ago and is distressed about getting them back. Prior to running out, she was taking Rexulti 1 mg, sertraline 100 mg and hydroxyzine 25, along with COC, every morning. She would occasionally take another dose of hydroxyzine later in the day. She wonders what alternatives she might consider, given the fatigue she experiences with this regimen.  History of disordered eating. Afraid that those behaviors will recur if her mood isn't improved.  Review of Systems  Constitutional: Negative.   HENT: Negative.   Eyes: Negative.   Respiratory: Negative.   Cardiovascular: Negative.   Gastrointestinal: Positive for diarrhea (after every meal x 5 months).  Endocrine: Negative.   Genitourinary: Negative.   Musculoskeletal: Negative.   Skin: Negative.   Allergic/Immunologic: Negative.   Neurological: Negative.   Hematological: Negative.   Psychiatric/Behavioral: Positive for decreased concentration and dysphoric mood. Negative for confusion, hallucinations, self-injury and suicidal ideas. Sleep disturbance: way too much. The patient is nervous/anxious.      Depression screen [facility] 2/9 09/22/2017 02/22/2017  Decreased Interest 1 1  Down, Depressed, Hopeless 1 2  PHQ - 2 Score 2 3  Altered sleeping 2 -  Tired, decreased energy 2 -  Change in appetite 1 -  Feeling bad or failure about yourself  0 -  Trouble concentrating 1 -  Moving slowly or fidgety/restless 0 -    Suicidal thoughts 0 -  PHQ-9 Score 8 -  Difficult doing work/chores Somewhat difficult -    GAD 7 : Generalized Anxiety Score 09/22/2017  Nervous, Anxious, on Edge 3  Control/stop worrying 2  Worry too much - different things 1  Trouble relaxing 1  Restless 0  Easily annoyed or irritable 1  Afraid - awful might happen 0  Total GAD 7 Score 8  Anxiety Difficulty Somewhat difficult      Prior to Admission medications   Medication Sig Start Date End Date Taking? Authorizing Provider  Cyanocobalamin (VITAMIN B-12 PO) Take by mouth.   Yes [provider]  hydrOXYzine (VISTARIL) 25 MG capsule TAKE 1 CAPSULE BY MOUTH 3 TIMES DAILY ASNEEDED FOR ANXIETY. 6/60/63  Yes Millican, Christy, NP  REXULTI 1 MG TABS TAKE 1 TABLET(1 MG) BY MOUTH DAILY 06/19/17  Yes Jonathon Resides T, FNP  sertraline (ZOLOFT) 100 MG tablet Take 1.5 tablets (150 mg total) by mouth daily. 05/22/17  Yes Trude Mcburney, FNP  SPRINTEC 28 0.25-35 MG-MCG tablet Take 1 tablet by mouth daily. 02/03/17   [provider]     No Known Allergies   Patient Active Problem List   Diagnosis Date Noted  . Lateral epicondylitis, left elbow 06/22/2017  . Anxiety and depression 10/17/2013  . Eating disorder in remission 10/17/2013     Family History  Problem Relation Age of Onset  . Hypertension Father   . Cancer Maternal Grandmother  blood and skin  . Mental illness Mother        depression  . COPD Paternal Grandfather   . Mental illness Sister        anxiety, depression, disordered eating     Social History   Socioeconomic History  . Marital status: Single    Spouse name: Not on file  . Number of children: 0  . Years of education: Not on file  . Highest education level: Not on file  Social Needs  . Financial resource strain: Not hard at all  . Food insecurity - worry: Never true  . Food insecurity - inability: Never true  . Transportation needs - medical: No  . Transportation needs  - non-medical: No  Occupational History  . Occupation: Ship broker    Comment: Environmental consultant  . Occupation: part time [employer] Use  . Smoking status: Never Smoker  . Smokeless tobacco: Never Used  Substance and Sexual Activity  . Alcohol use: No  . Drug use: No  . Sexual activity: No  Other Topics Concern  . Not on file  Social History Narrative   Lives on campus with a roommate.   Family lives in [place], [place].   Twin sister attends college in [place], which is hard.         Objective:  Physical Exam  Constitutional: She is oriented to person, place, and time. She appears well-developed and well-nourished. She is active and cooperative. No distress.  BP 108/76 (BP Location: Left Arm, Patient Position: Sitting, Cuff Size: Large)   Pulse 91   Temp 97.9 F (36.6 C) (Oral)   Resp 18   Ht 5' 3.02" (1.601 m)   Wt 217 lb 6.4 oz (98.6 kg)   SpO2 99%   BMI 38.49 kg/m   HENT:  Head: Normocephalic and atraumatic.  Right Ear: Hearing normal.  Left Ear: Hearing normal.  Eyes: Conjunctivae are normal. No scleral icterus.  Neck: Normal range of motion. Neck supple. No thyromegaly present.  Cardiovascular: Normal rate, regular rhythm and normal heart sounds.  Pulses:      Radial pulses are 2+ on the right side, and 2+ on the left side.  Pulmonary/Chest: Effort normal and breath sounds normal.  Lymphadenopathy:       Head (right side): No tonsillar, no preauricular, no posterior auricular and no occipital adenopathy present.       Head (left side): No tonsillar, no preauricular, no posterior auricular and no occipital adenopathy present.    She has no cervical adenopathy.       Right: No supraclavicular adenopathy present.       Left: No supraclavicular adenopathy present.  Neurological: She is alert and oriented to person, place, and time. No sensory deficit.  Skin: Skin is warm, dry and intact. No rash noted. No cyanosis or erythema. Nails show no clubbing.    Psychiatric: Her speech is normal and behavior is normal. Judgment and thought content normal. Her mood appears anxious. Her affect is labile. Her affect is not angry, not blunt and not inappropriate. Cognition and memory are normal. She exhibits a depressed mood.  initially she was tearful and appeared distressed. By the time I entered the room, she appeared relaxed and behavior, mood and affect were appropriate.             Assessment & Plan:   Problem List Items Addressed This Visit    Anxiety and depression - Primary    Not controlled. Risk  for recurrent disordered eating behaviors. Resume Rexulti, sertraline and hydroxyzine. Reserve hydroxyzine for HS, or otherwise PRN, rather than QAM. If remains fatigued, consider Rexulti as cause and consider changing dose time to QHS. If fatigue improves, but anxiety persists, consider increasing Rexulti dose. Refer to psychiatry for confirmation of diagnosis and medication management.  Continue therapy with Jeremy Johann.      Relevant Medications   Brexpiprazole (REXULTI) 1 MG TABS   hydrOXYzine (VISTARIL) 25 MG capsule   sertraline (ZOLOFT) 100 MG tablet   Other Relevant Orders   Ambulatory referral to Psychiatry   Eating disorder in remission   Relevant Orders   Ambulatory referral to Psychiatry    Other Visit Diagnoses    Uses birth control       Relevant Medications   SPRINTEC 28 0.25-35 MG-MCG tablet       Return in about 4 weeks (around 10/20/2017) for re-evaluation of mood.   Fara Chute, PA-C [facility]

## 2017-09-22 NOTE — Patient Instructions (Addendum)
Continue your visits with Sharon Barton.  If you have not heard from a psychiatry office to schedule a visit in the next 2 weeks, please let me know.  Stop taking the hydroxyzine each morning (but do take it if you feel anxious). If that doesn't help the sleepiness/fatigue, or if your anxiety isn't controlled, then we can increase the Rexulti.  If you get in with psychiatry quickly, please cancel the 4 weeks follow-up with me and reschedule for about 3 months.     IF you received an x-ray today, you will receive an invoice from [facility]. Please contact [facility] at [phone] with questions or concerns regarding your invoice.   IF you received labwork today, you will receive an invoice from [facility]. Please contact [facility] at [phone] with questions or concerns regarding your invoice.   Our billing staff will not be able to assist you with questions regarding bills from these companies.  You will be contacted with the lab results as soon as they are available. The fastest way to get your results is to activate your My Chart account. Instructions are located on the last page of this paperwork. If you have not heard from [name] regarding the results in 2 weeks, please contact this office.

## 2017-09-24 ENCOUNTER — Encounter: Payer: Self-pay | Admitting: Physician Assistant

## 2017-09-24 NOTE — Assessment & Plan Note (Signed)
Currently in remission, but at risk due to uncontrolled anxiety and depression.

## 2017-09-24 NOTE — Assessment & Plan Note (Signed)
Not controlled. Risk for recurrent disordered eating behaviors. Resume Rexulti, sertraline and hydroxyzine. Reserve hydroxyzine for HS, or otherwise PRN, rather than QAM. If remains fatigued, consider Rexulti as cause and consider changing dose time to QHS. If fatigue improves, but anxiety persists, consider increasing Rexulti dose. Refer to psychiatry for confirmation of diagnosis and medication management.  Continue therapy with Jeremy Johann.

## 2017-10-17 ENCOUNTER — Ambulatory Visit: Payer: Self-pay | Admitting: Physician Assistant

## 2017-11-16 ENCOUNTER — Encounter: Payer: Self-pay | Admitting: Physician Assistant

## 2017-11-24 ENCOUNTER — Ambulatory Visit: Payer: Self-pay | Admitting: Physician Assistant

## 2017-12-06 ENCOUNTER — Encounter: Payer: Self-pay | Admitting: Physician Assistant

## 2019-02-02 ENCOUNTER — Other Ambulatory Visit: Payer: Self-pay

## 2019-02-02 ENCOUNTER — Ambulatory Visit
Admission: EM | Admit: 2019-02-02 | Discharge: 2019-02-02 | Disposition: A | Discharge status: Home / Self Care | Payer: BC Managed Care – PPO | Attending: Physician Assistant | Admitting: Physician Assistant

## 2019-02-02 DIAGNOSIS — M62838 Other muscle spasm: Secondary | ICD-10-CM

## 2019-02-02 DIAGNOSIS — M7522 Bicipital tendinitis, left shoulder: Secondary | ICD-10-CM

## 2019-02-02 DIAGNOSIS — M25512 Pain in left shoulder: Secondary | ICD-10-CM | POA: Diagnosis not present

## 2019-02-02 MED ORDER — CYCLOBENZAPRINE HCL 10 MG PO TABS: 10.0000 mg | ORAL_TABLET | Freq: Three times a day (TID) | ORAL | 0 refills | Status: AC | PRN

## 2019-02-02 NOTE — ED Triage Notes (Signed)
Patient complains of left shoulder pain that is worse with movement. Patient states that area has been throbbing since 4am without injury.

## 2019-02-02 NOTE — Discharge Instructions (Addendum)
SHOULDER PAIN/BICEPS TENDINITIS/MUSCLE SPASMS: Begin OTC ibuprofen and Tylenol for inflammation and pain. Try prescribed muscle relaxer at home. It can make you drowsy, so use carefully. Ice the area. Avoid painful activities such as lifting > 5-10 lbs, pushing pulling, overhead reaching until pain improves. F/u with PCP or our office if no improvement or condition worsens over the next 7-10 days

## 2019-02-02 NOTE — ED Provider Notes (Signed)
[facility]    CSN: [id number] Arrival date & time: 02/02/19  1209     History   Chief Complaint Chief Complaint  Patient presents with  . Shoulder Pain    left    HPI Sharon Barton is a 23 y.o. female. Patient presents with onset of left shoulder pain early this morning. States she woke up and the shoulder was hurting. Denies injury. She says she tried to go to work this morning but when she was pushing carts and lifting groceries, the pain seemed to get worse. She says pain is currently 3/10, but gets up to 7/10 when she pushes or pulls anything. Pain also worse with reaching over and touching other shoulder. Has taken Ibuprofen w/o much relief. She denies radiation of pain, weakness, numbness, tingling. She says this is not work related. No prior history of injury of this shoulder. Denies any other concerns today.  HPI  Past Medical History:  Diagnosis Date  . Ankle sprain 06/2017  . Anxiety and depression   . Asthma   . Biceps tendon rupture 05/2017   LEFT  . Depression   . Eating disorder 2011    Patient Active Problem List   Diagnosis Date Noted  . Lateral epicondylitis, left elbow 06/22/2017  . Anxiety and depression 10/17/2013  . Eating disorder in remission 10/17/2013    Past Surgical History:  Procedure Laterality Date  . FRACTURE SURGERY    . psych hospitalization Sept 2017      OB History   No obstetric history on file.      Home Medications    Prior to Admission medications   Medication Sig Start Date End Date Taking? Authorizing Provider  Cyanocobalamin (VITAMIN B-12 PO) Take by mouth.   Yes [provider]  hydrOXYzine (VISTARIL) 25 MG capsule TAKE 1 CAPSULE BY MOUTH 3 TIMES DAILY ASNEEDED FOR ANXIETY. 09/22/17  Yes Jeffery, Chelle, PA  sertraline (ZOLOFT) 100 MG tablet Take 1.5 tablets (150 mg total) by mouth daily. 09/22/17  Yes Jeffery, Chelle, PA  SPRINTEC 28 0.25-35 MG-MCG tablet Take 1 tablet by mouth daily.  09/22/17  Yes Jeffery, Chelle, PA  Brexpiprazole (REXULTI) 1 MG TABS Take 1 tablet (1 mg total) by mouth daily. 09/22/17   Harrison Mons, PA  cyclobenzaprine (FLEXERIL) 10 MG tablet Take 1 tablet (10 mg total) by mouth 3 (three) times daily as needed for up to 7 days for muscle spasms. 02/02/19 02/09/19  Danton Clap, PA-C    Family History Family History  Problem Relation Age of Onset  . Hypertension Father   . Cancer Maternal Grandmother        blood and skin  . Mental illness Mother        depression  . COPD Paternal Grandfather   . Mental illness Sister        anxiety, depression, disordered eating    Social History Social History   Tobacco Use  . Smoking status: Never Smoker  . Smokeless tobacco: Never Used  Substance Use Topics  . Alcohol use: No  . Drug use: No     Allergies   Patient has no known allergies.   Review of Systems Review of Systems  Constitutional: Negative for fatigue and fever.  Musculoskeletal: Positive for arthralgias and myalgias. Negative for back pain, neck pain and neck stiffness.  Skin: Negative for color change, rash and wound.  Neurological: Negative for weakness, numbness and headaches.     Physical Exam Triage Vital Signs  ED Triage Vitals  Enc Vitals Group     BP 02/02/19 1221 134/87     Pulse Rate 02/02/19 1221 80     Resp 02/02/19 1221 16     Temp 02/02/19 1221 98.2 F (36.8 C)     Temp Source 02/02/19 1221 Oral     SpO2 02/02/19 1221 99 %     Weight 02/02/19 1219 198 lb (89.8 kg)     Height 02/02/19 1219 5\' 3"  (1.6 m)     Head Circumference --      Peak Flow --      Pain Score 02/02/19 1219 4     Pain Loc --      Pain Edu? --      Excl. in [place]? --    No data found.  Updated Vital Signs BP 134/87 (BP Location: Right Arm)   Pulse 80   Temp 98.2 F (36.8 C) (Oral)   Resp 16   Ht 5\' 3"  (1.6 m)   Wt 198 lb (89.8 kg)   LMP 01/12/2019   SpO2 99%   BMI 35.07 kg/m      Physical Exam Vitals signs and nursing note  reviewed.  Constitutional:      General: She is not in acute distress.    Appearance: She is obese. She is not ill-appearing or toxic-appearing.  HENT:     Head: Normocephalic and atraumatic.  Neck:     Musculoskeletal: Normal range of motion and neck supple. No neck rigidity or muscular tenderness.  Pulmonary:     Effort: Pulmonary effort is normal.     Breath sounds: Normal breath sounds.  Musculoskeletal:     Comments: LEFT SHOULDER: Mild soft tissue swelling anterior shoulder. TTP bicep groove and AC joint. Full ROM of shoulder. 5/5 strength bilat. Some pain with empty can test and crossover test. Normal sensation. NVI  Skin:    General: Skin is warm and dry.     Findings: No bruising, lesion or rash.  Neurological:     Mental Status: She is alert.  Psychiatric:        Mood and Affect: Mood normal.        Behavior: Behavior normal.        Thought Content: Thought content normal.      UC Treatments / Results  Labs (all labs ordered are listed, but only abnormal results are displayed) Labs Reviewed - No data to display  EKG None  Radiology No results found.  Procedures Procedures (including critical care time)  Medications Ordered in UC Medications - No data to display  Initial Impression / Assessment and Plan / UC Course  I have reviewed the triage vital signs and the nursing notes.  Pertinent labs & imaging results that were available during my care of the patient were reviewed by me and considered in my medical decision making (see chart for details).    Final Clinical Impressions(s) / UC Diagnoses   Final diagnoses:  Biceps tendinitis of left upper extremity  Muscle spasm  Acute pain of left shoulder     Discharge Instructions     SHOULDER PAIN/BICEPS TENDINITIS/MUSCLE SPASMS: Begin OTC ibuprofen and Tylenol for inflammation and pain. Try prescribed muscle relaxer at home. It can make you drowsy, so use carefully. Ice the area. Avoid painful  activities such as lifting > 5-10 lbs, pushing pulling, overhead reaching until pain improves. F/u with PCP or our office if no improvement or condition worsens over the next 7-10 days  ED Prescriptions    Medication Sig Dispense Auth. Provider   cyclobenzaprine (FLEXERIL) 10 MG tablet Take 1 tablet (10 mg total) by mouth 3 (three) times daily as needed for up to 7 days for muscle spasms. 20 tablet Danton Clap, PA-C     Controlled Substance Prescriptions [place] Controlled Substance Registry consulted? Not Applicable   Gretta Cool 02/02/19 1325

## 2019-06-11 ENCOUNTER — Ambulatory Visit (INDEPENDENT_AMBULATORY_CARE_PROVIDER_SITE_OTHER): Payer: BC Managed Care – PPO | Admitting: Clinical

## 2019-06-11 DIAGNOSIS — F89 Unspecified disorder of psychological development: Secondary | ICD-10-CM

## 2019-07-31 ENCOUNTER — Ambulatory Visit: Payer: BC Managed Care – PPO | Admitting: Clinical

## 2019-08-05 ENCOUNTER — Ambulatory Visit (INDEPENDENT_AMBULATORY_CARE_PROVIDER_SITE_OTHER): Payer: BC Managed Care – PPO | Admitting: Clinical

## 2019-08-05 DIAGNOSIS — F89 Unspecified disorder of psychological development: Secondary | ICD-10-CM | POA: Diagnosis not present

## 2019-08-08 ENCOUNTER — Ambulatory Visit: Payer: BC Managed Care – PPO | Attending: Internal Medicine

## 2019-08-08 DIAGNOSIS — Z20822 Contact with and (suspected) exposure to covid-19: Secondary | ICD-10-CM

## 2019-08-09 LAB — NOVEL CORONAVIRUS, NAA: SARS-CoV-2, NAA: NOT DETECTED

## 2019-09-04 ENCOUNTER — Ambulatory Visit (INDEPENDENT_AMBULATORY_CARE_PROVIDER_SITE_OTHER): Payer: BC Managed Care – PPO | Admitting: Clinical

## 2019-09-04 DIAGNOSIS — F89 Unspecified disorder of psychological development: Secondary | ICD-10-CM

## 2019-10-18 ENCOUNTER — Ambulatory Visit: Payer: BC Managed Care – PPO | Attending: Internal Medicine

## 2019-10-18 DIAGNOSIS — Z23 Encounter for immunization: Secondary | ICD-10-CM

## 2019-10-18 NOTE — Progress Notes (Signed)
   Covid-19 Vaccination Clinic  Name:  Demii Leder    MRN: [id number] DOB: 1995/10/28  10/18/2019  Ms. Carandang was observed post Covid-19 immunization for 15 minutes without incident. She was provided with Vaccine Information Sheet and instruction to access the V-Safe system.   Ms. Huaracha was instructed to call 911 with any severe reactions post vaccine: Marland Kitchen Difficulty breathing  . Swelling of face and throat  . A fast heartbeat  . A bad rash all over body  . Dizziness and weakness

## 2019-10-23 ENCOUNTER — Ambulatory Visit (INDEPENDENT_AMBULATORY_CARE_PROVIDER_SITE_OTHER): Payer: BC Managed Care – PPO | Admitting: Clinical

## 2019-10-23 DIAGNOSIS — F840 Autistic disorder: Secondary | ICD-10-CM

## 2019-11-19 ENCOUNTER — Ambulatory Visit: Payer: BC Managed Care – PPO | Attending: Internal Medicine

## 2019-11-19 DIAGNOSIS — Z23 Encounter for immunization: Secondary | ICD-10-CM

## 2019-11-19 NOTE — Progress Notes (Signed)
   Covid-19 Vaccination Clinic  Name:  Sharon Barton    MRN: [id number] DOB: [birth year]  11/19/2019  [name] was observed post Covid-19 immunization for 15 minutes without incident. She was provided with Vaccine Information Sheet and instruction to access the V-Safe system.   [name] was instructed to call 911 with any severe reactions post vaccine: Marland Kitchen Difficulty breathing  . Swelling of face and throat  . A fast heartbeat  . A bad rash all over body  . Dizziness and weakness   Immunizations Administered    Name Date Dose VIS Date Route   Pfizer COVID-19 Vaccine 11/19/2019 10:29 AM 0.3 mL 07/26/2019 Intramuscular   Manufacturer: [employer], [employer]   Lot: [id number]   [place]: [id number]

## 2020-01-06 ENCOUNTER — Other Ambulatory Visit: Payer: Self-pay

## 2020-01-06 ENCOUNTER — Ambulatory Visit (HOSPITAL_COMMUNITY)
Admission: EM | Admit: 2020-01-06 | Discharge: 2020-01-06 | Disposition: A | Discharge status: Home / Self Care | Payer: BC Managed Care – PPO | Attending: Family Medicine | Admitting: Family Medicine

## 2020-01-06 ENCOUNTER — Encounter (HOSPITAL_COMMUNITY): Payer: Self-pay

## 2020-01-06 DIAGNOSIS — M25562 Pain in left knee: Secondary | ICD-10-CM | POA: Diagnosis not present

## 2020-01-06 DIAGNOSIS — C5702 Malignant neoplasm of left fallopian tube: Secondary | ICD-10-CM

## 2020-01-06 MED ORDER — CYCLOBENZAPRINE HCL 10 MG PO TABS: 10.0000 mg | ORAL_TABLET | Freq: Two times a day (BID) | ORAL | 0 refills | Status: DC | PRN

## 2020-01-06 MED ORDER — IBUPROFEN 800 MG PO TABS: 800.0000 mg | ORAL_TABLET | Freq: Three times a day (TID) | ORAL | 0 refills | Status: AC | PRN

## 2020-01-06 NOTE — ED Provider Notes (Signed)
[facility]   [id number] 01/06/20 Arrival Time: 1931  [id number] PAIN  SUBJECTIVE: History from: patient. Sharon Barton is a 24 y.o. female complains of left knee pain that began yesterday when she fell while roller skating in the street. Denies a precipitating event or specific injury. Localizes the pain to the anterior aspect of the tibial plateau.  Describes the pain as intermittent and sharp and achy in character.  Has made no attempts to treat at home. Symptoms are made worse with activity. Denies similar symptoms in the past. Has applied knee brace that she reports provides support for weight bearing.  Denies fever, chills, erythema, ecchymosis, effusion, weakness, numbness and tingling, saddle paresthesias, loss of bowel or bladder function.      ROS: As per HPI.  All other pertinent ROS negative.     Past Medical History:  Diagnosis Date  . Ankle sprain 06/2017  . Anxiety and depression   . Asthma   . Biceps tendon rupture 05/2017   LEFT  . Depression   . Eating disorder 2011   Past Surgical History:  Procedure Laterality Date  . FRACTURE SURGERY    . psych hospitalization Sept 2017     No Known Allergies No current facility-administered medications on file prior to encounter.   Current Outpatient Medications on File Prior to Encounter  Medication Sig Dispense Refill  . Brexpiprazole (REXULTI) 1 MG TABS Take 1 tablet (1 mg total) by mouth daily. 90 tablet 3  . Cyanocobalamin (VITAMIN B-12 PO) Take by mouth.    . hydrOXYzine (VISTARIL) 25 MG capsule TAKE 1 CAPSULE BY MOUTH 3 TIMES DAILY ASNEEDED FOR ANXIETY. 90 capsule 0  . sertraline (ZOLOFT) 100 MG tablet Take 1.5 tablets (150 mg total) by mouth daily. 135 tablet 3  . SPRINTEC 28 0.25-35 MG-MCG tablet Take 1 tablet by mouth daily. 3 Package 3   Social History   Socioeconomic History  . Marital status: Single    Spouse name: Not on file  . Number of children: 0  . Years of education: Not on  file  . Highest education level: Not on file  Occupational History  . Occupation: Ship broker    Comment: Environmental consultant  . Occupation: part time [employer] Use  . Smoking status: Never Smoker  . Smokeless tobacco: Never Used  Substance and Sexual Activity  . Alcohol use: No  . Drug use: No  . Sexual activity: Never  Other Topics Concern  . Not on file  Social History Narrative   Lives on campus with a roommate.   Family lives in [place], [place].   Twin sister attends college in [place], which is hard.   Social Determinants of Health   Financial Resource Strain:   . Difficulty of Paying Living Expenses:   Food Insecurity:   . Worried About Charity fundraiser in the Last Year:   . Arboriculturist in the Last Year:   Transportation Needs:   . Film/video editor (Medical):   Marland Kitchen Lack of Transportation (Non-Medical):   Physical Activity:   . Days of Exercise per Week:   . Minutes of Exercise per Session:   Stress:   . Feeling of Stress :   Social Connections:   . Frequency of Communication with Friends and Family:   . Frequency of Social Gatherings with Friends and Family:   . Attends Religious Services:   . Active Member of Clubs or Organizations:   . Attends Club  or Organization Meetings:   Marland Kitchen Marital Status:   Intimate Partner Violence:   . Fear of Current or Ex-Partner:   . Emotionally Abused:   Marland Kitchen Physically Abused:   . Sexually Abused:    Family History  Problem Relation Age of Onset  . Hypertension Father   . Cancer Maternal Grandmother        blood and skin  . Mental illness Mother        depression  . COPD Paternal Grandfather   . Mental illness Sister        anxiety, depression, disordered eating    OBJECTIVE:  Vitals:   01/06/20 2034  BP: (!) 137/94  Pulse: (!) 103  Resp: 18  Temp: 98.2 F (36.8 C)  TempSrc: Oral  SpO2: 100%    General appearance: ALERT; in no acute distress.  Head: NCAT Lungs: Normal respiratory  effort CV: pedal pulses 2+ bilaterally. Cap refill < 2 seconds Musculoskeletal:  Inspection: Skin warm, dry, clear and intact without obvious erythema, effusion, or ecchymosis.  Palpation: tender to palpation at anterior aspect of tibial plateau ROM: FROM active and passive Strength: 5/5 shld abduction, 5/5 shld adduction, 5/5 elbow flexion, 5/5 elbow extension, 5/5 grip strength, 5/5 hip flexion, 5/5 knee abduction, 5/5 knee adduction, 5/5 knee flexion, 5/5 knee extension, 5/5 dorsiflexion, 5/5 plantar flexion Stability: Anterior/ posterior drawer intact Skin: warm and dry Neurologic: Ambulates without difficulty; Sensation intact about the upper/ lower extremities Psychological: alert and cooperative; normal mood and affect  DIAGNOSTIC STUDIES:  No results found.   ASSESSMENT & PLAN:  1. Acute pain of left knee   2. Malignant neoplasm of left fallopian tube (HCC)     Meds ordered this encounter  Medications  . ibuprofen (ADVIL) 800 MG tablet    Sig: Take 1 tablet (800 mg total) by mouth every 8 (eight) hours as needed for moderate pain.    Dispense:  21 tablet    Refill:  0    Order Specific Question:   Supervising Provider    Answer:   Chase Picket [id number]  . cyclobenzaprine (FLEXERIL) 10 MG tablet    Sig: Take 1 tablet (10 mg total) by mouth 2 (two) times daily as needed for muscle spasms.    Dispense:  20 tablet    Refill:  0    Order Specific Question:   Supervising Provider    Answer:   Chase Picket [id number]     Prescribed ibuprofen  Prescribed flexeril Continue conservative management of rest, ice, and gentle stretches Take ibuprofen as needed for pain relief (may cause abdominal discomfort, ulcers, and GI bleeds avoid taking with other NSAIDs) Take cyclobenzaprine at nighttime for symptomatic relief. Avoid driving or operating heavy machinery while using medication. Follow up with PCP if symptoms persist Return or go to the ER if you have any new or  worsening symptoms (fever, chills, chest pain, abdominal pain, changes in bowel or bladder habits, pain radiating into lower legs)   Follow up with Sports Medicine  Reviewed expectations re: course of current medical issues. Questions answered. Outlined signs and symptoms indicating need for more acute intervention. Patient verbalized understanding. After Visit Summary given.       Faustino Congress, NP 01/06/20 2112

## 2020-01-06 NOTE — Discharge Instructions (Addendum)
Take the ibuprofen as prescribed.  Rest and elevate your leg.  Apply ice packs 2-3 times a day for up to 20 minutes each.  Wear the Ace wrap as needed for comfort. Take the flexeril as needed for muscle spasms. This medication may make you sleepy, do not drive or operate heavy machinery while taking this medication.  Follow up with your primary care provider or an orthopedist if you symptoms continue or worsen;  Or if you develop new symptoms, such as numbness, tingling, or weakness.

## 2020-01-06 NOTE — ED Triage Notes (Signed)
Pt presents with left knee injury after a fall while skating yesterday.

## 2020-04-03 ENCOUNTER — Other Ambulatory Visit: Payer: Self-pay

## 2020-04-03 ENCOUNTER — Ambulatory Visit
Admission: RE | Admit: 2020-04-03 | Discharge: 2020-04-03 | Disposition: A | Discharge status: Home / Self Care | Payer: BC Managed Care – PPO | Source: Ambulatory Visit | Attending: Family Medicine | Admitting: Family Medicine

## 2020-04-03 VITALS — BP 125/100 | HR 109 | Temp 98.5°F | Resp 16 | Ht 63.0 in | Wt 198.0 lb

## 2020-04-03 DIAGNOSIS — Z791 Long term (current) use of non-steroidal anti-inflammatories (NSAID): Secondary | ICD-10-CM | POA: Insufficient documentation

## 2020-04-03 DIAGNOSIS — J029 Acute pharyngitis, unspecified: Secondary | ICD-10-CM | POA: Diagnosis not present

## 2020-04-03 DIAGNOSIS — J45909 Unspecified asthma, uncomplicated: Secondary | ICD-10-CM | POA: Diagnosis not present

## 2020-04-03 DIAGNOSIS — F329 Major depressive disorder, single episode, unspecified: Secondary | ICD-10-CM | POA: Diagnosis not present

## 2020-04-03 DIAGNOSIS — Z6835 Body mass index (BMI) 35.0-35.9, adult: Secondary | ICD-10-CM | POA: Diagnosis not present

## 2020-04-03 DIAGNOSIS — Z79899 Other long term (current) drug therapy: Secondary | ICD-10-CM | POA: Insufficient documentation

## 2020-04-03 DIAGNOSIS — F509 Eating disorder, unspecified: Secondary | ICD-10-CM | POA: Insufficient documentation

## 2020-04-03 DIAGNOSIS — Z20822 Contact with and (suspected) exposure to covid-19: Secondary | ICD-10-CM | POA: Insufficient documentation

## 2020-04-03 DIAGNOSIS — F419 Anxiety disorder, unspecified: Secondary | ICD-10-CM | POA: Diagnosis not present

## 2020-04-03 DIAGNOSIS — B9789 Other viral agents as the cause of diseases classified elsewhere: Secondary | ICD-10-CM | POA: Diagnosis not present

## 2020-04-03 DIAGNOSIS — J028 Acute pharyngitis due to other specified organisms: Secondary | ICD-10-CM | POA: Insufficient documentation

## 2020-04-03 LAB — GROUP A STREP BY PCR: Group A Strep by PCR: NOT DETECTED

## 2020-04-03 MED ORDER — LIDOCAINE VISCOUS HCL 2 % MT SOLN: OROMUCOSAL | 0 refills | Status: AC

## 2020-04-03 MED ORDER — NAPROXEN 500 MG PO TABS: 500.0000 mg | ORAL_TABLET | Freq: Two times a day (BID) | ORAL | 0 refills | Status: AC | PRN

## 2020-04-03 NOTE — ED Provider Notes (Signed)
[facility]    CSN: [id number] Arrival date & time: 04/03/20  1204  History   Chief Complaint Chief Complaint  Patient presents with  . Appointment  . Sore Throat   HPI  24 year old female presents with sore throat and headache.  Patient reports that her symptoms started 3 days ago.  She reports sore throat and difficulty swallowing.  Pain 7/10 in severity.  Associated headache.  Denies fever.  No other respiratory symptoms.  No known sick contacts.  No known relieving factors.  No other complaints this time.  Past Medical History:  Diagnosis Date  . Ankle sprain 06/2017  . Anxiety and depression   . Asthma   . Biceps tendon rupture 05/2017   LEFT  . Depression   . Eating disorder 2011    Patient Active Problem List   Diagnosis Date Noted  . Lateral epicondylitis, left elbow 06/22/2017  . Anxiety and depression 10/17/2013  . Eating disorder in remission 10/17/2013    Past Surgical History:  Procedure Laterality Date  . FRACTURE SURGERY    . psych hospitalization Sept 2017      OB History   No obstetric history on file.      Home Medications    Prior to Admission medications   Medication Sig Start Date End Date Taking? Authorizing Provider  buPROPion (WELLBUTRIN XL) 150 MG 24 hr tablet Take 150 mg by mouth every morning. 03/24/20  Yes [provider]  ibuprofen (ADVIL) 800 MG tablet Take 1 tablet (800 mg total) by mouth every 8 (eight) hours as needed for moderate pain. 01/06/20  Yes Faustino Congress, NP  methylphenidate (RITALIN) 10 MG tablet Take by mouth. 12/25/19  Yes [provider]  sertraline (ZOLOFT) 100 MG tablet Take 1.5 tablets (150 mg total) by mouth daily. 09/22/17  Yes Jeffery, Chelle, PA  SPRINTEC 28 0.25-35 MG-MCG tablet Take 1 tablet by mouth daily. 09/22/17  Yes Jeffery, Chelle, PA  lidocaine (XYLOCAINE) 2 % solution Gargle 15 mL every 3 hours as needed. May swallow if desired. 04/03/20   Coral Spikes, DO  naproxen  (NAPROSYN) 500 MG tablet Take 1 tablet (500 mg total) by mouth 2 (two) times daily as needed for moderate pain. 04/03/20   Coral Spikes, DO    Family History Family History  Problem Relation Age of Onset  . Hypertension Father   . Cancer Maternal Grandmother        blood and skin  . Mental illness Mother        depression  . COPD Paternal Grandfather   . Mental illness Sister        anxiety, depression, disordered eating    Social History Social History   Tobacco Use  . Smoking status: Never Smoker  . Smokeless tobacco: Never Used  Vaping Use  . Vaping Use: Never used  Substance Use Topics  . Alcohol use: No  . Drug use: No     Allergies   Patient has no known allergies.   Review of Systems Review of Systems  Constitutional: Negative for fever.  HENT: Positive for sore throat and trouble swallowing.   Neurological: Positive for headaches.   Physical Exam Triage Vital Signs ED Triage Vitals  Enc Vitals Group     BP 04/03/20 1230 (!) 125/100     Pulse Rate 04/03/20 1230 (!) 109     Resp 04/03/20 1230 16     Temp 04/03/20 1230 98.5 F (36.9 C)  Temp Source 04/03/20 1230 Oral     SpO2 04/03/20 1230 97 %     Weight 04/03/20 1224 197 lb 15.6 oz (89.8 kg)     Height 04/03/20 1224 5\' 3"  (1.6 m)     Head Circumference --      Peak Flow --      Pain Score 04/03/20 1224 7     Pain Loc --      Pain Edu? --      Excl. in [place]? --    Updated Vital Signs BP (!) 125/100 (BP Location: Right Arm)   Pulse (!) 109   Temp 98.5 F (36.9 C) (Oral)   Resp 16   Ht 5\' 3"  (1.6 m)   Wt 89.8 kg   SpO2 97%   BMI 35.07 kg/m   Visual Acuity Right Eye Distance:   Left Eye Distance:   Bilateral Distance:    Right Eye Near:   Left Eye Near:    Bilateral Near:     Physical Exam Constitutional:      General: She is not in acute distress.    Appearance: Normal appearance. She is obese. She is not ill-appearing.  HENT:     Head: Normocephalic and atraumatic.     Right  Ear: Tympanic membrane normal.     Left Ear: Tympanic membrane normal.     Mouth/Throat:     Pharynx: Posterior oropharyngeal erythema present. No oropharyngeal exudate.  Eyes:     General:        Right eye: No discharge.        Left eye: No discharge.     Conjunctiva/sclera: Conjunctivae normal.  Cardiovascular:     Rate and Rhythm: Regular rhythm. Tachycardia present.  Pulmonary:     Effort: Pulmonary effort is normal.     Breath sounds: Normal breath sounds. No wheezing, rhonchi or rales.  Neurological:     Mental Status: She is alert.    UC Treatments / Results  Labs (all labs ordered are listed, but only abnormal results are displayed) Labs Reviewed  GROUP A STREP BY PCR  SARS CORONAVIRUS 2 (TAT 6-24 HRS)    EKG   Radiology No results found.  Procedures Procedures (including critical care time)  Medications Ordered in UC Medications - No data to display  Initial Impression / Assessment and Plan / UC Course  I have reviewed the triage vital signs and the nursing notes.  Pertinent labs & imaging results that were available during my care of the patient were reviewed by me and considered in my medical decision making (see chart for details).    24 year old female presents with pharyngitis.  Likely viral.  Strep negative today.  Naproxen and viscous lidocaine as directed.  Awaiting Covid test results.  Final Clinical Impressions(s) / UC Diagnoses   Final diagnoses:  Viral pharyngitis     Discharge Instructions     Lots of fluids.  Medication as prescribed.  Take care  Dr. Lacinda Axon    ED Prescriptions    Medication Sig Dispense Auth. Provider   naproxen (NAPROSYN) 500 MG tablet Take 1 tablet (500 mg total) by mouth 2 (two) times daily as needed for moderate pain. 30 tablet Maeva Dant G, DO   lidocaine (XYLOCAINE) 2 % solution Gargle 15 mL every 3 hours as needed. May swallow if desired. 200 mL Coral Spikes, DO     PDMP not reviewed this encounter.    Coral Spikes, [place] 04/03/20 1342

## 2020-04-03 NOTE — ED Triage Notes (Signed)
Patient c/o sore throat and HAs for the past 3 days.  Patient denies fevers.

## 2020-04-03 NOTE — Discharge Instructions (Signed)
Lots of fluids.  Medication as prescribed.  Take care  Dr. Trinity Haun  

## 2020-04-04 LAB — SARS CORONAVIRUS 2 (TAT 6-24 HRS): SARS Coronavirus 2: NEGATIVE
